# Patient Record
Sex: Male | Born: 1940 | Race: Black or African American | Hispanic: No | Marital: Married | Smoking: Never smoker
Health system: Southern US, Community
[De-identification: ages and names within clinical notes are randomized; demographics above are authoritative.]

## PROBLEM LIST (undated history)

## (undated) DIAGNOSIS — N289 Disorder of kidney and ureter, unspecified: Secondary | ICD-10-CM

## (undated) DIAGNOSIS — E119 Type 2 diabetes mellitus without complications: Secondary | ICD-10-CM

## (undated) DIAGNOSIS — I2510 Atherosclerotic heart disease of native coronary artery without angina pectoris: Secondary | ICD-10-CM

## (undated) DIAGNOSIS — I739 Peripheral vascular disease, unspecified: Secondary | ICD-10-CM

## (undated) DIAGNOSIS — I219 Acute myocardial infarction, unspecified: Secondary | ICD-10-CM

## (undated) HISTORY — PX: CHOLECYSTECTOMY: SHX55

## (undated) HISTORY — PX: BELOW KNEE LEG AMPUTATION: SUR23

---

## 2010-10-11 ENCOUNTER — Emergency Department (HOSPITAL_BASED_OUTPATIENT_CLINIC_OR_DEPARTMENT_OTHER)
Admission: EM | Admit: 2010-10-11 | Discharge: 2010-10-11 | Disposition: A | Discharge status: Home / Self Care | Payer: Medicare Other | Attending: Emergency Medicine | Admitting: Emergency Medicine

## 2010-10-11 ENCOUNTER — Encounter: Payer: Self-pay | Admitting: *Deleted

## 2010-10-11 DIAGNOSIS — Z79899 Other long term (current) drug therapy: Secondary | ICD-10-CM | POA: Insufficient documentation

## 2010-10-11 DIAGNOSIS — C9000 Multiple myeloma not having achieved remission: Secondary | ICD-10-CM | POA: Insufficient documentation

## 2010-10-11 DIAGNOSIS — M79643 Pain in unspecified hand: Secondary | ICD-10-CM

## 2010-10-11 DIAGNOSIS — M79609 Pain in unspecified limb: Secondary | ICD-10-CM | POA: Insufficient documentation

## 2010-10-11 HISTORY — DX: Peripheral vascular disease, unspecified: I73.9

## 2010-10-11 MED ORDER — OXYCODONE-ACETAMINOPHEN 5-325 MG PO TABS
ORAL_TABLET | ORAL | Status: AC
  Filled 2010-10-11: qty 2

## 2010-10-11 MED ORDER — OXYCODONE-ACETAMINOPHEN 5-325 MG PO TABS
2.0000 | ORAL_TABLET | Freq: Once | ORAL | Status: AC
  Administered 2010-10-11: 2 via ORAL

## 2010-10-11 MED ORDER — OXYCODONE-ACETAMINOPHEN 5-325 MG PO TABS: 1.0000 | ORAL_TABLET | Freq: Four times a day (QID) | ORAL | Status: AC | PRN

## 2010-10-11 NOTE — ED Provider Notes (Signed)
History     CSN: [id number] Arrival date & time: 10/11/2010 12:24 AM  Chief Complaint  Patient presents with  . Hand Pain    (Consider location/radiation/quality/duration/timing/severity/associated sxs/prior treatment) HPI This is a 70 year old black male with a history of multiple myeloma who is status post bilateral below-knee amputation in May of this year. He complains of a three-week history of pain in his hands. The pain is worse in the right hand and in the left hand. It is a dull deep pain that is moderate to severe. It is principally present in the first, second and third fingers of the right hand and is accompanied by paresthesias. The pain in the left hand is primarily in the joints and is less severe. He recently ran out of his gabapentin and is also been learning to use an electric wheelchair; he suspects that these may have contributed to the pain. Last week he had pain in his knees but this is resolved. He is undergoing physical therapy to regain ambulation using his prostheses. He has also run out of his oxycodone.  Past Medical History  Diagnosis Date  . Peripheral vascular disease   . Multiple myeloma     Past Surgical History  Procedure Date  . Cholecystectomy   . Below knee leg amputation     No family history on file.  History  Substance Use Topics  . Smoking status: Never Smoker   . Smokeless tobacco: Not on file  . Alcohol Use: No      Review of Systems  All other systems reviewed and are negative.    Allergies  Review of patient's allergies indicates no known allergies.  Home Medications   Current Outpatient Rx  Name Route Sig Dispense Refill  . ASPIRIN 325 MG PO TABS Oral Take 325 mg by mouth daily.      Marland Kitchen CILOSTAZOL 100 MG PO TABS Oral Take 100 mg by mouth 2 (two) times daily.      Marland Kitchen GABAPENTIN 300 MG PO CAPS Oral Take 300 mg by mouth 3 (three) times daily.      Marland Kitchen LENALIDOMIDE 15 MG PO CAPS Oral Take 10 mg by mouth daily.      .  OXYCODONE-ACETAMINOPHEN 5-325 MG PO TABS Oral Take 1 tablet by mouth every 4 (four) hours as needed.      Marland Kitchen PROMETHAZINE HCL 25 MG PO TABS Oral Take 25 mg by mouth every 6 (six) hours as needed.      Marland Kitchen ROSUVASTATIN CALCIUM 20 MG PO TABS Oral Take 20 mg by mouth daily.        BP 126/61  Pulse 68  Temp(Src) 97.7 F (36.5 C) (Oral)  Resp 18  Wt 157 lb (71.215 kg)  SpO2 98%  Physical Exam General: Well-developed, well-nourished male in no acute distress; appearance consistent with age of record HENT: normocephalic, atraumatic Eyes: pupils equal round and reactive to light; extraocular muscles intact; arcus senilis bilaterally Neck: supple Heart: regular rate and rhythm Lungs: clear to auscultation bilaterally Abdomen: soft; nontender; nondistended Extremities: Bilateral below knee amputations; arthritic changes of hands; no tenderness of hands. Negative tine else and Phalen's test; pain present on movement of hands; capillary refill in fingertips brisk Neurologic: Awake, alert and oriented;motor function intact in all extremities and symmetric; no facial droop Skin: Warm and dry; violaceous lesions of proximal interphalangeal joints of the fifth fourth and third fingers of the left hand (patient has had these for an extended period of time and are known  to his oncologist)   ED Course  Procedures (including critical care time)     MDM  The exact etiology of this pain is not clear at this time. The right hand pain is in a pattern suggestive of carpal tunnel syndrome Tinel's and Phalen's tests were negative. The lesions of the left hand could represent a metamyelocyte this but as noted he has been followed for this already. Having run out of his gabapentin and oxycodone I also have contributed to his pain especially in the context of new use of the hands with electrical wheelchair. We will treat his symptoms and refer him back to his physicians.        Hanley Seamen, MD 10/11/10  [phone]

## 2010-10-11 NOTE — ED Notes (Signed)
C/o hand pain and tingling in both hands since earlier today

## 2014-01-14 ENCOUNTER — Other Ambulatory Visit: Payer: Self-pay

## 2014-01-14 ENCOUNTER — Emergency Department (HOSPITAL_BASED_OUTPATIENT_CLINIC_OR_DEPARTMENT_OTHER): Payer: Medicare HMO

## 2014-01-14 ENCOUNTER — Emergency Department (HOSPITAL_BASED_OUTPATIENT_CLINIC_OR_DEPARTMENT_OTHER)
Admission: EM | Admit: 2014-01-14 | Discharge: 2014-01-15 | Disposition: A | Discharge status: Other Acute Inpatient Hospital | Payer: Medicare HMO | Attending: Emergency Medicine | Admitting: Emergency Medicine

## 2014-01-14 ENCOUNTER — Encounter (HOSPITAL_BASED_OUTPATIENT_CLINIC_OR_DEPARTMENT_OTHER): Payer: Self-pay | Admitting: Emergency Medicine

## 2014-01-14 DIAGNOSIS — Z79899 Other long term (current) drug therapy: Secondary | ICD-10-CM | POA: Insufficient documentation

## 2014-01-14 DIAGNOSIS — Z89612 Acquired absence of left leg above knee: Secondary | ICD-10-CM | POA: Diagnosis not present

## 2014-01-14 DIAGNOSIS — Z89511 Acquired absence of right leg below knee: Secondary | ICD-10-CM | POA: Insufficient documentation

## 2014-01-14 DIAGNOSIS — H9312 Tinnitus, left ear: Secondary | ICD-10-CM | POA: Diagnosis present

## 2014-01-14 DIAGNOSIS — Z8579 Personal history of other malignant neoplasms of lymphoid, hematopoietic and related tissues: Secondary | ICD-10-CM | POA: Diagnosis not present

## 2014-01-14 DIAGNOSIS — Z8679 Personal history of other diseases of the circulatory system: Secondary | ICD-10-CM | POA: Diagnosis not present

## 2014-01-14 DIAGNOSIS — Z7982 Long term (current) use of aspirin: Secondary | ICD-10-CM | POA: Diagnosis not present

## 2014-01-14 LAB — CBC WITH DIFFERENTIAL/PLATELET
BASOS PCT: 0 % (ref 0–1)
Basophils Absolute: 0 10*3/uL (ref 0.0–0.1)
EOS ABS: 0.3 10*3/uL (ref 0.0–0.7)
Eosinophils Relative: 4 % (ref 0–5)
HCT: 34 % — ABNORMAL LOW (ref 39.0–52.0)
Hemoglobin: 11.1 g/dL — ABNORMAL LOW (ref 13.0–17.0)
LYMPHS ABS: 1.4 10*3/uL (ref 0.7–4.0)
Lymphocytes Relative: 17 % (ref 12–46)
MCH: 31.9 pg (ref 26.0–34.0)
MCHC: 32.6 g/dL (ref 30.0–36.0)
MCV: 97.7 fL (ref 78.0–100.0)
Monocytes Absolute: 1.2 10*3/uL — ABNORMAL HIGH (ref 0.1–1.0)
Monocytes Relative: 14 % — ABNORMAL HIGH (ref 3–12)
NEUTROS ABS: 5.4 10*3/uL (ref 1.7–7.7)
NEUTROS PCT: 65 % (ref 43–77)
PLATELETS: 247 10*3/uL (ref 150–400)
RBC: 3.48 MIL/uL — AB (ref 4.22–5.81)
RDW: 15.8 % — ABNORMAL HIGH (ref 11.5–15.5)
WBC: 8.3 10*3/uL (ref 4.0–10.5)

## 2014-01-14 LAB — BASIC METABOLIC PANEL
ANION GAP: 7 (ref 5–15)
BUN: 17 mg/dL (ref 6–23)
CO2: 21 mmol/L (ref 19–32)
Calcium: 8.1 mg/dL — ABNORMAL LOW (ref 8.4–10.5)
Chloride: 110 mEq/L (ref 96–112)
Creatinine, Ser: 2.06 mg/dL — ABNORMAL HIGH (ref 0.50–1.35)
GFR, EST AFRICAN AMERICAN: 35 mL/min — AB (ref >90)
GFR, EST NON AFRICAN AMERICAN: 31 mL/min — AB (ref >90)
Glucose, Bld: 108 mg/dL — ABNORMAL HIGH (ref 70–99)
POTASSIUM: 4.3 mmol/L (ref 3.5–5.1)
SODIUM: 138 mmol/L (ref 135–145)

## 2014-01-14 LAB — PROTIME-INR
INR: 0.98 (ref 0.00–1.49)
PROTHROMBIN TIME: 13 s (ref 11.6–15.2)

## 2014-01-14 MED ORDER — SODIUM CHLORIDE 0.9 % IV SOLN
Freq: Once | INTRAVENOUS | Status: AC
  Administered 2014-01-14: 50 mL/h via INTRAVENOUS

## 2014-01-14 NOTE — ED Notes (Signed)
Pt alert, NAD, calm, interactive, resps e/u, speaking in clear complete sentences. C/o ringing and hearing change in L ear. Noticed upon waking at 1800.  Describes ringing as new & constant. (denies: fever, nvd, cough, congestion, cold sx, dizziness, drainage or pain), mentions recent chemo. Denies using drops or Q tips. Wax present in ear noted. Wife present at [facility].

## 2014-01-14 NOTE — ED Notes (Signed)
Pt reports he awoke from sleep with ringing sound in his ears denies any event or injury denies tylenol ingestion

## 2014-01-14 NOTE — ED Provider Notes (Signed)
CSN: [id number]     Arrival date & time 01/14/14  1903 History  This chart was scribed for Quintella Reichert, MD by Jeanell Sparrow, ED Scribe. This patient was seen in room MH11/MH11 and the patient's care was started at 9:38 PM.   Chief Complaint  Patient presents with  . Tinnitus   The history is provided by the patient and the spouse. No language interpreter was used.   HPI Comments: Arthur Dixon is a 74 y.o. male who presents to the Emergency Department complaining of tinnitus in his left ear that started about 3.5 hours ago (530pm), when he woke up.  He was last without complaints at 4pm.  He reports that the ringing sensation is without any pain. He states that he has no prior hx of ringing sensation. He reports that he is currently on Plavix, aspirin, and pletal. He states that he has a hx of DM and multiple myeloma and is undergoing chemotherapy. He denies any numbness, fever, vomiting, or dizziness.  Sxs are moderate, constant, and unchanging.  There are no alleviating or worsening factors.    Past Medical History  Diagnosis Date  . Peripheral vascular disease   . Multiple myeloma    Past Surgical History  Procedure Laterality Date  . Cholecystectomy    . Below knee leg amputation     History reviewed. No pertinent family history. History  Substance Use Topics  . Smoking status: Never Smoker   . Smokeless tobacco: Not on file  . Alcohol Use: No    Review of Systems  Constitutional: Negative for fever.  HENT: Positive for tinnitus. Negative for ear pain.   Gastrointestinal: Negative for vomiting.  Neurological: Negative for dizziness and numbness.  All other systems reviewed and are negative.   Allergies  Review of patient's allergies indicates no known allergies.  Home Medications   Prior to Admission medications   Medication Sig Start Date End Date Taking? Authorizing Provider  aspirin 325 MG tablet Take 325 mg by mouth daily.      Historical Provider, MD   cilostazol (PLETAL) 100 MG tablet Take 100 mg by mouth 2 (two) times daily.      Historical Provider, MD  gabapentin (NEURONTIN) 300 MG capsule Take 300 mg by mouth 3 (three) times daily.      Historical Provider, MD  lenalidomide (REVLIMID) 15 MG capsule Take 10 mg by mouth daily.      Historical Provider, MD  oxyCODONE-acetaminophen (PERCOCET) 5-325 MG per tablet Take 1 tablet by mouth every 4 (four) hours as needed.      Historical Provider, MD  promethazine (PHENERGAN) 25 MG tablet Take 25 mg by mouth every 6 (six) hours as needed.      Historical Provider, MD  rosuvastatin (CRESTOR) 20 MG tablet Take 20 mg by mouth daily.      Historical Provider, MD   BP 158/77 mmHg  Pulse 64  Temp(Src) 98.8 F (37.1 C) (Oral)  Resp 18  Wt 144 lb (65.318 kg)  SpO2 100% Physical Exam  Constitutional: He is oriented to person, place, and time. He appears well-developed and well-nourished.  HENT:  Head: Normocephalic and atraumatic.  Eyes: EOM are normal. Pupils are equal, round, and reactive to light.  Neck:  No carotid bruit  Cardiovascular: Normal rate and regular rhythm.   No murmur heard. Pulmonary/Chest: Effort normal and breath sounds normal. No respiratory distress.  Abdominal: Soft. There is no tenderness. There is no rebound.  Musculoskeletal:  Left aka, right  bka  Neurological: He is alert and oriented to person, place, and time. No cranial nerve deficit. Coordination normal.  5/5 strength in all four extremities, sensation to light touch intact in all four extremities.   Skin: Skin is warm and dry.  Psychiatric: He has a normal mood and affect. His behavior is normal.  Nursing note and vitals reviewed.   ED Course  Procedures (including critical care time) DIAGNOSTIC STUDIES: Oxygen Saturation is 100% on RA, normal by my interpretation.    COORDINATION OF CARE: 9:42 PM- Pt advised of plan for treatment which includes radiology and pt agrees.  Labs Review Labs Reviewed  CBC  WITH DIFFERENTIAL - Abnormal; Notable for the following:    RBC 3.48 (*)    Hemoglobin 11.1 (*)    HCT 34.0 (*)    RDW 15.8 (*)    Monocytes Relative 14 (*)    Monocytes Absolute 1.2 (*)    All other components within normal limits  BASIC METABOLIC PANEL  PROTIME-INR    Imaging Review Ct Head Wo Contrast  01/14/2014   CLINICAL DATA:  Left-sided tinnitus and change in hearing, acute onset. Initial encounter.  EXAM: CT HEAD WITHOUT CONTRAST  TECHNIQUE: Contiguous axial images were obtained from the base of the skull through the vertex without intravenous contrast.  COMPARISON:  None.  FINDINGS: There appears to be an evolving acute infarct at the left occipital lobe, with vaguely decreased attenuation at the left parietal region, raising question for underlying left MCA territory infarct. The latter could be artifactual in nature. There is no evidence of hemorrhagic transformation at this time. No significant vasogenic edema is seen.  Mild periventricular and subcortical white matter change likely reflects small vessel ischemic microangiopathy. Mild cerebellar atrophy is noted.  The brainstem and fourth ventricle are within normal limits. The basal ganglia are unremarkable in appearance. No midline shift is seen.  There is no evidence of fracture; visualized osseous structures are unremarkable in appearance. The orbits are within normal limits. The paranasal sinuses and mastoid air cells are well-aerated. Sclerotic change is noted at the left mastoid process. No significant soft tissue abnormalities are seen.  IMPRESSION: 1. 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 Possible vaguely decreased attenuation at the left parietal region. This raises question for underlying left MCA territory infarct, though the latter might be artifactual in nature. No evidence for hemorrhagic transformation at this time. No evidence of midline shift. 2. Mild small vessel ischemic microangiopathy; mild  cerebellar atrophy seen.  These results were called by telephone at the time of interpretation on 01/14/2014 at 10:31 pm to Dr. Quintella Reichert, who verbally acknowledged these results.   Electronically Signed   By: Garald Balding M.D.   On: 01/14/2014 22:31     EKG Interpretation None     Unable to upload EKG in MUSE.  EKG with sinus rhythm with 1st degree AV block, LAD, no acute ST changes.   MDM   Final diagnoses:  Tinnitus, left   Pt here for evaluation of ringing in the left ear. Patient is neurologically intact on examination. CT scan with evidence of acute evolving occipital infarct. On repeat neurologic exam patient continues to be neurologically intact with a NIH stroke scale of 0. Discussed with patient findings of CT scan and recommendation for discussion with neurology, patient prefers [facility] as that is where he gets most of his care. Discussed with Dr. Sherry Ruffing with neurology at [facility] who agrees to see the patient  in transfer. Patient will be transferred to the emergency department for further evaluation.  I personally performed the services described in this documentation, which was scribed in my presence. The recorded information has been reviewed and is accurate.     Quintella Reichert, MD 01/14/14 2322

## 2014-01-14 NOTE — ED Notes (Signed)
I took ECG, took new set of vitals, placed patient on monitor.

## 2014-01-14 NOTE — ED Notes (Signed)
Dr. Ralene Bathe in to speak with pt & family

## 2014-05-12 ENCOUNTER — Emergency Department (HOSPITAL_BASED_OUTPATIENT_CLINIC_OR_DEPARTMENT_OTHER)
Admission: EM | Admit: 2014-05-12 | Discharge: 2014-05-13 | Disposition: A | Discharge status: Home / Self Care | Payer: Medicare HMO | Attending: Emergency Medicine | Admitting: Emergency Medicine

## 2014-05-12 ENCOUNTER — Encounter (HOSPITAL_BASED_OUTPATIENT_CLINIC_OR_DEPARTMENT_OTHER): Payer: Self-pay

## 2014-05-12 ENCOUNTER — Emergency Department (HOSPITAL_BASED_OUTPATIENT_CLINIC_OR_DEPARTMENT_OTHER): Payer: Medicare HMO

## 2014-05-12 DIAGNOSIS — I739 Peripheral vascular disease, unspecified: Secondary | ICD-10-CM | POA: Insufficient documentation

## 2014-05-12 DIAGNOSIS — I2510 Atherosclerotic heart disease of native coronary artery without angina pectoris: Secondary | ICD-10-CM | POA: Diagnosis not present

## 2014-05-12 DIAGNOSIS — R140 Abdominal distension (gaseous): Secondary | ICD-10-CM | POA: Diagnosis present

## 2014-05-12 DIAGNOSIS — Z9049 Acquired absence of other specified parts of digestive tract: Secondary | ICD-10-CM | POA: Diagnosis not present

## 2014-05-12 DIAGNOSIS — N289 Disorder of kidney and ureter, unspecified: Secondary | ICD-10-CM

## 2014-05-12 DIAGNOSIS — Z7982 Long term (current) use of aspirin: Secondary | ICD-10-CM | POA: Diagnosis not present

## 2014-05-12 DIAGNOSIS — Z89612 Acquired absence of left leg above knee: Secondary | ICD-10-CM | POA: Insufficient documentation

## 2014-05-12 DIAGNOSIS — C9000 Multiple myeloma not having achieved remission: Secondary | ICD-10-CM | POA: Insufficient documentation

## 2014-05-12 DIAGNOSIS — Z89511 Acquired absence of right leg below knee: Secondary | ICD-10-CM | POA: Insufficient documentation

## 2014-05-12 DIAGNOSIS — Z79899 Other long term (current) drug therapy: Secondary | ICD-10-CM | POA: Diagnosis not present

## 2014-05-12 DIAGNOSIS — D649 Anemia, unspecified: Secondary | ICD-10-CM

## 2014-05-12 DIAGNOSIS — R109 Unspecified abdominal pain: Secondary | ICD-10-CM

## 2014-05-12 HISTORY — DX: Atherosclerotic heart disease of native coronary artery without angina pectoris: I25.10

## 2014-05-12 HISTORY — DX: Disorder of kidney and ureter, unspecified: N28.9

## 2014-05-12 LAB — URINALYSIS, ROUTINE W REFLEX MICROSCOPIC
Bilirubin Urine: NEGATIVE
Glucose, UA: 100 mg/dL — AB
Ketones, ur: NEGATIVE mg/dL
Leukocytes, UA: NEGATIVE
NITRITE: NEGATIVE
PH: 7.5 (ref 5.0–8.0)
Specific Gravity, Urine: 1.013 (ref 1.005–1.030)
Urobilinogen, UA: 0.2 mg/dL (ref 0.0–1.0)

## 2014-05-12 LAB — COMPREHENSIVE METABOLIC PANEL
ALBUMIN: 3.2 g/dL — AB (ref 3.5–5.2)
ALK PHOS: 115 U/L (ref 39–117)
ALT: 8 U/L (ref 0–53)
ANION GAP: 9 (ref 5–15)
AST: 16 U/L (ref 0–37)
BILIRUBIN TOTAL: 0.4 mg/dL (ref 0.3–1.2)
BUN: 19 mg/dL (ref 6–23)
CALCIUM: 8.8 mg/dL (ref 8.4–10.5)
CHLORIDE: 102 mmol/L (ref 96–112)
CO2: 25 mmol/L (ref 19–32)
CREATININE: 2.46 mg/dL — AB (ref 0.50–1.35)
GFR calc Af Amer: 29 mL/min — ABNORMAL LOW (ref >90)
GFR, EST NON AFRICAN AMERICAN: 25 mL/min — AB (ref >90)
Glucose, Bld: 146 mg/dL — ABNORMAL HIGH (ref 70–99)
Potassium: 3.9 mmol/L (ref 3.5–5.1)
SODIUM: 136 mmol/L (ref 135–145)
Total Protein: 7.3 g/dL (ref 6.0–8.3)

## 2014-05-12 LAB — URINE MICROSCOPIC-ADD ON

## 2014-05-12 LAB — LIPASE, BLOOD: LIPASE: 18 U/L (ref 11–59)

## 2014-05-12 NOTE — ED Provider Notes (Signed)
CSN: [id number]     Arrival date & time 05/12/14  2147 History  This chart was scribed for Delora Fuel, MD by Chester Holstein, ED Scribe. This patient was seen in room MH03/MH03 and the patient's care was started at 11:27 PM.    Chief Complaint  Patient presents with  . Bloated     The history is provided by the patient. No language interpreter was used.   HPI Comments: Arthur Dixon is a 74 y.o. male with PMHx of PVD, CAD, and renal disorder who presents to the Emergency Department complaining of abdominal bloating with onset 3 months ago worsening 1 week ago. Pt denies any aggravating or alleviating factors. Pt notes associated nausea. Family member states pt had an episode similar to an anxiety attack and was rocking back and forth. She states pt had an US abdomen done on 05/04/14 but states they have not been called with results. Pt with h/o multiple myeloma. Pt has been getting chemotherapy, which family member reports has been halted due to pt's complaint of bloating, and was taken off his Paxil. Pt denies fever, chills, diaphoresis, abdominal pain. vomiting, constipation, and diarrhea. Pt's PCP is Dr. Lajuan Lines.   Past Medical History  Diagnosis Date  . Peripheral vascular disease   . Multiple myeloma   . Coronary artery disease   . Renal disorder    Past Surgical History  Procedure Laterality Date  . Cholecystectomy    . Below knee leg amputation     No family history on file. History  Substance Use Topics  . Smoking status: Never Smoker   . Smokeless tobacco: Not on file  . Alcohol Use: No    Review of Systems  Constitutional: Negative for fever, chills and diaphoresis.  Gastrointestinal: Positive for nausea and abdominal distention. Negative for vomiting, abdominal pain, diarrhea and constipation.  All other systems reviewed and are negative.     Allergies  Review of patient's allergies indicates no known allergies.  Home Medications   Prior to Admission medications    Medication Sig Start Date End Date Taking? Authorizing Provider  acyclovir (ZOVIRAX) 400 MG tablet Take 400 mg by mouth 5 (five) times daily.   Yes Historical Provider, MD  amLODipine (NORVASC) 10 MG tablet Take 10 mg by mouth daily.   Yes Historical Provider, MD  atorvastatin (LIPITOR) 40 MG tablet Take 40 mg by mouth daily at 6 PM.   Yes Historical Provider, MD  calcitRIOL (ROCALTROL) 0.25 MCG capsule Take 0.25 mcg by mouth daily.   Yes Historical Provider, MD  clonazePAM (KLONOPIN) 0.5 MG tablet Take 0.5 mg by mouth 2 (two) times daily as needed for anxiety.   Yes Historical Provider, MD  oxyCODONE (OXY IR/ROXICODONE) 5 MG immediate release tablet Take 5 mg by mouth every 4 (four) hours as needed for severe pain.   Yes Historical Provider, MD  PARoxetine (PAXIL) 10 MG tablet Take 10 mg by mouth daily.   Yes Historical Provider, MD  prochlorperazine (COMPAZINE) 10 MG tablet Take 10 mg by mouth every 6 (six) hours as needed for nausea or vomiting.   Yes Historical Provider, MD  sodium bicarbonate 650 MG tablet Take 650 mg by mouth 4 (four) times daily.   Yes Historical Provider, MD  aspirin 325 MG tablet Take 325 mg by mouth daily.      Historical Provider, MD  cilostazol (PLETAL) 100 MG tablet Take 100 mg by mouth 2 (two) times daily.      Historical Provider, MD  gabapentin (NEURONTIN) 300 MG capsule Take 300 mg by mouth 3 (three) times daily.      Historical Provider, MD  lenalidomide (REVLIMID) 15 MG capsule Take 10 mg by mouth daily.      Historical Provider, MD  oxyCODONE-acetaminophen (PERCOCET) 5-325 MG per tablet Take 1 tablet by mouth every 4 (four) hours as needed.      Historical Provider, MD  promethazine (PHENERGAN) 25 MG tablet Take 25 mg by mouth every 6 (six) hours as needed.      Historical Provider, MD  rosuvastatin (CRESTOR) 20 MG tablet Take 20 mg by mouth daily.      Historical Provider, MD   BP 145/79 mmHg  Pulse 62  Temp(Src) 98.3 F (36.8 C) (Oral)  Resp 20  Wt 144  lb (65.318 kg)  SpO2 98% Physical Exam  Constitutional: He is oriented to person, place, and time. He appears well-developed and well-nourished.  HENT:  Head: Normocephalic.  Eyes: Conjunctivae are normal. Pupils are equal, round, and reactive to light.  Neck: Normal range of motion. Neck supple. No JVD present.  Cardiovascular: Normal rate, regular rhythm and normal heart sounds.   No murmur heard. Pulmonary/Chest: Effort normal. He has no wheezes. He has no rales. He exhibits no tenderness.  Abdominal: Soft. Bowel sounds are normal. He exhibits no distension and no mass. There is no tenderness.  Musculoskeletal: Normal range of motion. He exhibits no edema.  Right BKA and left AKA  Lymphadenopathy:    He has no cervical adenopathy.  Neurological: He is alert and oriented to person, place, and time. No cranial nerve deficit. He exhibits normal muscle tone. Coordination normal.  Skin: Skin is warm and dry. No rash noted.  Psychiatric: His behavior is normal.  flat affect  Nursing note and vitals reviewed.   ED Course  Procedures (including critical care time) DIAGNOSTIC STUDIES: Oxygen Saturation is 98% on room air, normal by my interpretation.    COORDINATION OF CARE: 11:36 PM Discussed treatment plan with patient at beside, the patient agrees with the plan and has no further questions at this time.   Labs Review Results for orders placed or performed during the hospital encounter of 05/12/14  Comprehensive metabolic panel  Result Value Ref Range   Sodium 136 135 - 145 mmol/L   Potassium 3.9 3.5 - 5.1 mmol/L   Chloride 102 96 - 112 mmol/L   CO2 25 19 - 32 mmol/L   Glucose, Bld 146 (H) 70 - 99 mg/dL   BUN 19 6 - 23 mg/dL   Creatinine, Ser 2.46 (H) 0.50 - 1.35 mg/dL   Calcium 8.8 8.4 - 10.5 mg/dL   Total Protein 7.3 6.0 - 8.3 g/dL   Albumin 3.2 (L) 3.5 - 5.2 g/dL   AST 16 0 - 37 U/L   ALT 8 0 - 53 U/L   Alkaline Phosphatase 115 39 - 117 U/L   Total Bilirubin 0.4 0.3 -  1.2 mg/dL   GFR calc non Af Amer 25 (L) >90 mL/min   GFR calc Af Amer 29 (L) >90 mL/min   Anion gap 9 5 - 15  CBC with Differential  Result Value Ref Range   WBC 7.0 4.0 - 10.5 K/uL   RBC 3.79 (L) 4.22 - 5.81 MIL/uL   Hemoglobin 11.8 (L) 13.0 - 17.0 g/dL   HCT 35.9 (L) 39.0 - 52.0 %   MCV 94.7 78.0 - 100.0 fL   MCH 31.1 26.0 - 34.0 pg   MCHC 32.9  30.0 - 36.0 g/dL   RDW 13.8 11.5 - 15.5 %   Platelets 320 150 - 400 K/uL   Neutrophils Relative % 63 43 - 77 %   Lymphocytes Relative 19 12 - 46 %   Monocytes Relative 14 (H) 3 - 12 %   Eosinophils Relative 4 0 - 5 %   Basophils Relative 0 0 - 1 %   Neutro Abs 4.4 1.7 - 7.7 K/uL   Lymphs Abs 1.3 0.7 - 4.0 K/uL   Monocytes Absolute 1.0 0.1 - 1.0 K/uL   Eosinophils Absolute 0.3 0.0 - 0.7 K/uL   Basophils Absolute 0.0 0.0 - 0.1 K/uL   Smear Review LARGE PLATELETS PRESENT   Lipase, blood  Result Value Ref Range   Lipase 18 11 - 59 U/L  Urinalysis, Routine w reflex microscopic  Result Value Ref Range   Color, Urine YELLOW YELLOW   APPearance CLEAR CLEAR   Specific Gravity, Urine 1.013 1.005 - 1.030   pH 7.5 5.0 - 8.0   Glucose, UA 100 (A) NEGATIVE mg/dL   Hgb urine dipstick MODERATE (A) NEGATIVE   Bilirubin Urine NEGATIVE NEGATIVE   Ketones, ur NEGATIVE NEGATIVE mg/dL   Protein, ur >300 (A) NEGATIVE mg/dL   Urobilinogen, UA 0.2 0.0 - 1.0 mg/dL   Nitrite NEGATIVE NEGATIVE   Leukocytes, UA NEGATIVE NEGATIVE  Urine microscopic-add on  Result Value Ref Range   Squamous Epithelial / LPF RARE RARE   WBC, UA 0-2 <3 WBC/hpf   RBC / HPF 7-10 <3 RBC/hpf   Casts GRANULAR CAST (A) NEGATIVE   Imaging Review Ct Abdomen Pelvis Wo Contrast  05/13/2014   CLINICAL DATA:  Chronic abdominal bloating.  Initial encounter.  EXAM: CT ABDOMEN AND PELVIS WITHOUT CONTRAST  TECHNIQUE: Multidetector CT imaging of the abdomen and pelvis was performed following the standard protocol without IV contrast.  COMPARISON:  Abdominal ultrasound performed 05/03/2014   FINDINGS: The visualized lung bases are clear. Minimal calcification is noted at the aortic valve. A coronary artery stent is seen. Trace pericardial fluid remains within normal limits.  The liver and spleen are unremarkable in appearance. The patient is status post cholecystectomy, with clips noted along the gallbladder fossa. Diffuse apparent calcification is seen along the atrophic body and tail of the pancreas, with minimal calcification at the pancreatic head, likely reflecting sequelae of chronic pancreatitis. The adrenal glands are unremarkable.  Nonspecific perinephric stranding and fluid is noted bilaterally. A 6 mm nonobstructing stone is noted at the lower pole of the right kidney. There is no evidence of hydronephrosis. No renal or ureteral stones are seen. Given mild stranding extending along the ureters, mild pyelonephritis cannot be entirely excluded.  No free fluid is identified. The small bowel is unremarkable in appearance. The stomach is within normal limits.  Mild soft tissue inflammation is noted along the distal abdominal aorta and common iliac arteries. This may reflect a variety of etiologies. No aneurysmal dilatation is seen. Minimal calcification is seen along the abdominal aorta.  The appendix is normal in caliber, without evidence of appendicitis. Contrast progresses to the proximal transverse colon. The colon is unremarkable in appearance.  The bladder is moderately distended and grossly unremarkable. The prostate is enlarged, measuring 5.6 cm in transverse dimension. No inguinal lymphadenopathy is seen.  No acute osseous abnormalities are identified. A few high-density foci within the visualized osseous structures likely reflect bone islands.  IMPRESSION: 1. Mild soft tissue inflammation about the distal abdominal aorta and common iliac arteries. This  could reflect isolated chronic periaortitis or retroperitoneal fibrosis; no evidence of aneurysmal dilatation. Evaluation is somewhat  suboptimal without contrast. 2. Mild stranding and an fluid noted about both kidneys, with mild stranding along the proximally ureters. Mild pyelonephritis cannot be entirely excluded. Would correlate with urinalysis results. 3. Diffuse calcification along the atrophic body and tail of the pancreas, with minimal calcification at the pancreatic head, likely reflecting sequelae of chronic pancreatitis. 4. Nonobstructing 6 mm stone at the lower pole of the right kidney. 5. Enlarged prostate noted.   Electronically Signed   By: Garald Balding M.D.   On: 05/13/2014 02:17     MDM   Final diagnoses:  Abdominal pain, unspecified abdominal location    Abdominal discomfort of uncertain cause. Patient with history of multiple myeloma and currently not getting chemotherapy. Abdominal exam is benign. He has also has known renal insufficiency. Will get CT scan of abdomen and pelvis to look for evidence of pathology.  Laboratory workup shows renal insufficiency is slightly worse than baseline, and normochromic normocytic anemia which is asked she has somewhat improved from baseline and presumably is secondary to underlying myeloma. CT scan shows areas of inflammation around kidneys and aorta and I wonder if he may have some degree of retroperitoneal fibrosis from either his myeloma or the chemotherapy. This will need further outpatient workup by. I have explained the right third nebulizer findings to the patient and family. He has a follow-up appointment with his oncologist in about 2 weeks and is to keep that appointment. Consider gastroenterology evaluation if not improving with holiday from chemotherapy.   I personally performed the services described in this documentation, which was scribed in my presence. The recorded information has been reviewed and is accurate.       Delora Fuel, MD 83/25/49 [id number]

## 2014-05-12 NOTE — ED Notes (Signed)
Pt c/o abdominal bloating x3 months, denies n/v/d; states saw PCP for same with no relief. LNBM yesterday

## 2014-05-13 LAB — CBC WITH DIFFERENTIAL/PLATELET
BASOS ABS: 0 10*3/uL (ref 0.0–0.1)
BASOS PCT: 0 % (ref 0–1)
EOS ABS: 0.3 10*3/uL (ref 0.0–0.7)
Eosinophils Relative: 4 % (ref 0–5)
HEMATOCRIT: 35.9 % — AB (ref 39.0–52.0)
HEMOGLOBIN: 11.8 g/dL — AB (ref 13.0–17.0)
Lymphocytes Relative: 19 % (ref 12–46)
Lymphs Abs: 1.3 10*3/uL (ref 0.7–4.0)
MCH: 31.1 pg (ref 26.0–34.0)
MCHC: 32.9 g/dL (ref 30.0–36.0)
MCV: 94.7 fL (ref 78.0–100.0)
MONO ABS: 1 10*3/uL (ref 0.1–1.0)
Monocytes Relative: 14 % — ABNORMAL HIGH (ref 3–12)
NEUTROS ABS: 4.4 10*3/uL (ref 1.7–7.7)
Neutrophils Relative %: 63 % (ref 43–77)
Platelets: 320 10*3/uL (ref 150–400)
RBC: 3.79 MIL/uL — ABNORMAL LOW (ref 4.22–5.81)
RDW: 13.8 % (ref 11.5–15.5)
WBC: 7 10*3/uL (ref 4.0–10.5)

## 2014-05-13 MED ORDER — IOHEXOL 300 MG/ML  SOLN
50.0000 mL | Freq: Once | INTRAMUSCULAR | Status: AC | PRN
  Administered 2014-05-13: 50 mL via ORAL

## 2014-05-13 NOTE — Discharge Instructions (Signed)
Return if there are any problems. °

## 2015-10-09 ENCOUNTER — Emergency Department (HOSPITAL_BASED_OUTPATIENT_CLINIC_OR_DEPARTMENT_OTHER)
Admission: EM | Admit: 2015-10-09 | Discharge: 2015-10-09 | Disposition: A | Discharge status: Other Acute Inpatient Hospital | Payer: Medicare HMO | Attending: Emergency Medicine | Admitting: Emergency Medicine

## 2015-10-09 ENCOUNTER — Emergency Department (HOSPITAL_BASED_OUTPATIENT_CLINIC_OR_DEPARTMENT_OTHER): Payer: Medicare HMO

## 2015-10-09 ENCOUNTER — Encounter (HOSPITAL_BASED_OUTPATIENT_CLINIC_OR_DEPARTMENT_OTHER): Payer: Self-pay | Admitting: Emergency Medicine

## 2015-10-09 DIAGNOSIS — R109 Unspecified abdominal pain: Secondary | ICD-10-CM | POA: Insufficient documentation

## 2015-10-09 DIAGNOSIS — R1110 Vomiting, unspecified: Secondary | ICD-10-CM | POA: Diagnosis present

## 2015-10-09 DIAGNOSIS — E1122 Type 2 diabetes mellitus with diabetic chronic kidney disease: Secondary | ICD-10-CM | POA: Insufficient documentation

## 2015-10-09 DIAGNOSIS — Z992 Dependence on renal dialysis: Secondary | ICD-10-CM | POA: Insufficient documentation

## 2015-10-09 DIAGNOSIS — R7989 Other specified abnormal findings of blood chemistry: Secondary | ICD-10-CM

## 2015-10-09 DIAGNOSIS — I739 Peripheral vascular disease, unspecified: Secondary | ICD-10-CM | POA: Diagnosis not present

## 2015-10-09 DIAGNOSIS — C9000 Multiple myeloma not having achieved remission: Secondary | ICD-10-CM | POA: Diagnosis not present

## 2015-10-09 DIAGNOSIS — N186 End stage renal disease: Secondary | ICD-10-CM | POA: Insufficient documentation

## 2015-10-09 DIAGNOSIS — Z79899 Other long term (current) drug therapy: Secondary | ICD-10-CM | POA: Diagnosis not present

## 2015-10-09 DIAGNOSIS — R778 Other specified abnormalities of plasma proteins: Secondary | ICD-10-CM

## 2015-10-09 DIAGNOSIS — I2510 Atherosclerotic heart disease of native coronary artery without angina pectoris: Secondary | ICD-10-CM | POA: Insufficient documentation

## 2015-10-09 DIAGNOSIS — Z9049 Acquired absence of other specified parts of digestive tract: Secondary | ICD-10-CM | POA: Insufficient documentation

## 2015-10-09 DIAGNOSIS — I214 Non-ST elevation (NSTEMI) myocardial infarction: Secondary | ICD-10-CM

## 2015-10-09 DIAGNOSIS — Z7982 Long term (current) use of aspirin: Secondary | ICD-10-CM | POA: Diagnosis not present

## 2015-10-09 DIAGNOSIS — I252 Old myocardial infarction: Secondary | ICD-10-CM | POA: Insufficient documentation

## 2015-10-09 DIAGNOSIS — Z794 Long term (current) use of insulin: Secondary | ICD-10-CM | POA: Diagnosis not present

## 2015-10-09 HISTORY — DX: Acute myocardial infarction, unspecified: I21.9

## 2015-10-09 HISTORY — DX: Type 2 diabetes mellitus without complications: E11.9

## 2015-10-09 LAB — CBC WITH DIFFERENTIAL/PLATELET
BASOS ABS: 0 10*3/uL (ref 0.0–0.1)
BASOS PCT: 1 %
EOS ABS: 0.1 10*3/uL (ref 0.0–0.7)
Eosinophils Relative: 2 %
HEMATOCRIT: 33.7 % — AB (ref 39.0–52.0)
HEMOGLOBIN: 10 g/dL — AB (ref 13.0–17.0)
Lymphocytes Relative: 21 %
Lymphs Abs: 1.1 10*3/uL (ref 0.7–4.0)
MCH: 29.8 pg (ref 26.0–34.0)
MCHC: 29.7 g/dL — AB (ref 30.0–36.0)
MCV: 100.3 fL — ABNORMAL HIGH (ref 78.0–100.0)
Monocytes Absolute: 0.8 10*3/uL (ref 0.1–1.0)
Monocytes Relative: 16 %
NEUTROS ABS: 3.1 10*3/uL (ref 1.7–7.7)
NEUTROS PCT: 60 %
Platelets: 193 10*3/uL (ref 150–400)
RBC: 3.36 MIL/uL — ABNORMAL LOW (ref 4.22–5.81)
RDW: 17.8 % — ABNORMAL HIGH (ref 11.5–15.5)
WBC: 5.1 10*3/uL (ref 4.0–10.5)

## 2015-10-09 LAB — COMPREHENSIVE METABOLIC PANEL
ALK PHOS: 111 U/L (ref 38–126)
ALT: 15 U/L — ABNORMAL LOW (ref 17–63)
ANION GAP: 11 (ref 5–15)
AST: 20 U/L (ref 15–41)
Albumin: 3.2 g/dL — ABNORMAL LOW (ref 3.5–5.0)
BILIRUBIN TOTAL: 0.7 mg/dL (ref 0.3–1.2)
BUN: 23 mg/dL — ABNORMAL HIGH (ref 6–20)
CALCIUM: 8.3 mg/dL — AB (ref 8.9–10.3)
CO2: 27 mmol/L (ref 22–32)
Chloride: 101 mmol/L (ref 101–111)
Creatinine, Ser: 4.68 mg/dL — ABNORMAL HIGH (ref 0.61–1.24)
GFR calc Af Amer: 13 mL/min — ABNORMAL LOW (ref >60)
GFR calc non Af Amer: 11 mL/min — ABNORMAL LOW (ref >60)
GLUCOSE: 102 mg/dL — AB (ref 65–99)
POTASSIUM: 3.6 mmol/L (ref 3.5–5.1)
SODIUM: 139 mmol/L (ref 135–145)
TOTAL PROTEIN: 7.2 g/dL (ref 6.5–8.1)

## 2015-10-09 LAB — TROPONIN I
TROPONIN I: 0.09 ng/mL — AB (ref <0.03)
Troponin I: 0.08 ng/mL (ref <0.03)

## 2015-10-09 LAB — LIPASE, BLOOD: Lipase: 19 U/L (ref 11–51)

## 2015-10-09 MED ORDER — SODIUM CHLORIDE 0.9 % IV BOLUS (SEPSIS)
500.0000 mL | Freq: Once | INTRAVENOUS | Status: AC
  Administered 2015-10-09: 500 mL via INTRAVENOUS

## 2015-10-09 MED ORDER — ASPIRIN 325 MG PO TABS
325.0000 mg | ORAL_TABLET | Freq: Once | ORAL | Status: AC
  Administered 2015-10-09: 325 mg via ORAL
  Filled 2015-10-09: qty 1

## 2015-10-09 MED ORDER — OXYCODONE-ACETAMINOPHEN 5-325 MG PO TABS
1.0000 | ORAL_TABLET | ORAL | Status: DC | PRN
  Administered 2015-10-09: 1 via ORAL
  Filled 2015-10-09: qty 1

## 2015-10-09 MED ORDER — OXYCODONE-ACETAMINOPHEN 5-325 MG PO TABS: 1.0000 | ORAL_TABLET | Freq: Once | ORAL | Status: DC

## 2015-10-09 MED ORDER — SODIUM CHLORIDE 0.9 % IV BOLUS (SEPSIS)
1000.0000 mL | Freq: Once | INTRAVENOUS | Status: DC
  Administered 2015-10-09: 1000 mL via INTRAVENOUS

## 2015-10-09 NOTE — ED Notes (Signed)
Paged hospitalist at [facility] for doctor on patient, will call back on [phone]

## 2015-10-09 NOTE — ED Notes (Signed)
[place]-- referring to [employer] for admittings

## 2015-10-09 NOTE — ED Provider Notes (Addendum)
[place] DEPT [facility] Provider Note   CSN: [id number] Arrival date & time: 10/09/15  6151     History   Chief Complaint Chief Complaint  Patient presents with  . Emesis    HPI Jonmichael Beadnell is a 75 y.o. male hx of CAD with MI (last stent was 2011), multiple myeloma with weekly chemo injections, here with chest pain, diaphoresis. Patient states that he woke up around 2 AM this morning and broke out in a sweat and had some chest pain and diaphoresis. Denies any radiation to the pain. He had an episode of vomiting during that time. He states that he is pain-free currently. He states that he had 2 heart attacks before and Usually with atypical symptoms. He is also a dialysis patient and finish his dialysis yesterday. He feels lightheaded and dizzy today and wife took his pulse and was noted to be tachycardic around 120. No hx of PE. Has bilateral BKA.    The history is provided by the patient.    Past Medical History:  Diagnosis Date  . Coronary artery disease   . Diabetes mellitus without complication ([place])   . MI (myocardial infarction) ([place])   . Multiple myeloma   . Peripheral vascular disease ([place])   . Renal disorder     There are no active problems to display for this patient.   Past Surgical History:  Procedure Laterality Date  . BELOW KNEE LEG AMPUTATION    . CHOLECYSTECTOMY         Home Medications    Prior to Admission medications   Medication Sig Start Date End Date Taking? Authorizing Provider  acyclovir (ZOVIRAX) 400 MG tablet Take 400 mg by mouth 5 (five) times daily.   Yes Historical Provider, MD  amLODipine (NORVASC) 10 MG tablet Take 10 mg by mouth daily.   Yes Historical Provider, MD  aspirin 325 MG tablet Take 325 mg by mouth daily.     Yes Historical Provider, MD  gabapentin (NEURONTIN) 300 MG capsule Take 300 mg by mouth 3 (three) times daily.     Yes Historical Provider, MD  insulin detemir (LEVEMIR) 100 UNIT/ML injection Inject into the skin at  bedtime.   Yes Historical Provider, MD  ketorolac (ACULAR) 0.5 % ophthalmic solution 1 drop 4 (four) times daily.   Yes Historical Provider, MD  oxyCODONE-acetaminophen (PERCOCET) 5-325 MG per tablet Take 1 tablet by mouth every 4 (four) hours as needed.     Yes Historical Provider, MD  prednisoLONE acetate (PRED FORTE) 1 % ophthalmic suspension 1 drop 4 (four) times daily.   Yes Historical Provider, MD  prochlorperazine (COMPAZINE) 10 MG tablet Take 10 mg by mouth every 6 (six) hours as needed for nausea or vomiting.   Yes Historical Provider, MD  promethazine (PHENERGAN) 25 MG tablet Take 25 mg by mouth every 6 (six) hours as needed.     Yes Historical Provider, MD  sertraline (ZOLOFT) 50 MG tablet Take 50 mg by mouth daily.   Yes Historical Provider, MD  sevelamer carbonate (RENVELA) 800 MG tablet Take 800 mg by mouth 3 (three) times daily with meals.   Yes Historical Provider, MD  atorvastatin (LIPITOR) 40 MG tablet Take 40 mg by mouth daily at 6 PM.    Historical Provider, MD  calcitRIOL (ROCALTROL) 0.25 MCG capsule Take 0.25 mcg by mouth daily.    Historical Provider, MD  cilostazol (PLETAL) 100 MG tablet Take 100 mg by mouth 2 (two) times daily.  Historical Provider, MD  clonazePAM (KLONOPIN) 0.5 MG tablet Take 0.5 mg by mouth 2 (two) times daily as needed for anxiety.    Historical Provider, MD  lenalidomide (REVLIMID) 15 MG capsule Take 10 mg by mouth daily.      Historical Provider, MD  oxyCODONE (OXY IR/ROXICODONE) 5 MG immediate release tablet Take 5 mg by mouth every 4 (four) hours as needed for severe pain.    Historical Provider, MD  PARoxetine (PAXIL) 10 MG tablet Take 10 mg by mouth daily.    Historical Provider, MD  rosuvastatin (CRESTOR) 20 MG tablet Take 20 mg by mouth daily.      Historical Provider, MD  sodium bicarbonate 650 MG tablet Take 650 mg by mouth 4 (four) times daily.    Historical Provider, MD    Family History No family history on file.  Social  History Social History  Substance Use Topics  . Smoking status: Never Smoker  . Smokeless tobacco: Never Used  . Alcohol use No     Allergies   Review of patient's allergies indicates no known allergies.   Review of Systems Review of Systems  Cardiovascular: Positive for chest pain.  Gastrointestinal: Positive for vomiting.  All other systems reviewed and are negative.    Physical Exam Updated Vital Signs BP 125/82   Pulse (!) 45   Temp 97.7 F (36.5 C) (Oral)   Resp 15   Wt 143 lb (64.9 kg)   SpO2 97%   Physical Exam  Constitutional: He is oriented to person, place, and time.  Chronically ill appearing   HENT:  Head: Normocephalic.  Eyes: EOM are normal. Pupils are equal, round, and reactive to light.  Neck: Normal range of motion. Neck supple.  Cardiovascular: Normal rate, regular rhythm and normal heart sounds.   Pulmonary/Chest:  Crackles bilateral bases, no wheezing   Abdominal: Soft.  Mild epigastric tenderness, no rebound   Musculoskeletal:  Bilateral BKA, no obvious edema or tenderness or swelling   Neurological: He is alert and oriented to person, place, and time.  Skin: Skin is warm.  Psychiatric: He has a normal mood and affect.  Nursing note and vitals reviewed.    ED Treatments / Results  Labs (all labs ordered are listed, but only abnormal results are displayed) Labs Reviewed  CBC WITH DIFFERENTIAL/PLATELET - Abnormal; Notable for the following:       Result Value   RBC 3.36 (*)    Hemoglobin 10.0 (*)    HCT 33.7 (*)    MCV 100.3 (*)    MCHC 29.7 (*)    RDW 17.8 (*)    All other components within normal limits  COMPREHENSIVE METABOLIC PANEL - Abnormal; Notable for the following:    Glucose, Bld 102 (*)    BUN 23 (*)    Creatinine, Ser 4.68 (*)    Calcium 8.3 (*)    Albumin 3.2 (*)    ALT 15 (*)    GFR calc non Af Amer 11 (*)    GFR calc Af Amer 13 (*)    All other components within normal limits  TROPONIN I - Abnormal; Notable  for the following:    Troponin I 0.08 (*)    All other components within normal limits  LIPASE, BLOOD  URINALYSIS, ROUTINE W REFLEX MICROSCOPIC (NOT AT [facility])    EKG  EKG Interpretation  Date/Time:  Tuesday October 09 2015 10:11:35 EDT Ventricular Rate:  91 PR Interval:    QRS Duration: 97 QT  Interval:  398 QTC Calculation: 490 R Axis:   -65 Text Interpretation:  Sinus arrhythmia Multiple premature complexes, vent & supraven Left anterior fascicular block Anterior infarct, old Nonspecific T abnormalities, lateral leads TWI new since previous  Confirmed by Seham Gardenhire  MD, [place] ([id number]) on 10/09/2015 10:24:10 AM       Radiology Dg Chest 2 View  Result Date: 10/09/2015 CLINICAL DATA:  Chest pain EXAM: CHEST  2 VIEW COMPARISON:  06/08/2014 chest radiograph. FINDINGS: Stable cardiomediastinal silhouette with mild cardiomegaly. No pneumothorax. Small left pleural effusion. No right pleural effusion. Borderline mild pulmonary edema. Mild left basilar atelectasis. Cholecystectomy clips are seen in the upper abdomen. IMPRESSION: 1. Mild cardiomegaly with borderline mild pulmonary edema, suggesting mild congestive heart failure. 2. Small left pleural effusion and mild left basilar atelectasis. Electronically Signed   By: Ilona Sorrel M.D.   On: 10/09/2015 11:22   Dg Abd 2 Views  Result Date: 10/09/2015 CLINICAL DATA:  Abdominal pain. EXAM: ABDOMEN - 2 VIEW COMPARISON:  05/13/2014 CT abdomen/pelvis. FINDINGS: No disproportionately dilated small bowel loops. No significant air-fluid levels. No significant colonic stool. No evidence of pneumatosis or pneumoperitoneum. Coarse calcifications in the left upper quadrant in a linear configuration, consistent with chronic pancreatitis. Lower right renal 7 mm stone. Cholecystectomy clips are seen in the right upper quadrant of the abdomen. Stable bone island in the right upper pubic symphysis. 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 Facet arthropathy in the lower lumbar spine. Small left pleural effusion. IMPRESSION: 1. Nonobstructive bowel gas pattern. 2. Chronic pancreatitis. 3. Right renal stone. 4. Small left pleural effusion. Electronically Signed   By: Ilona Sorrel M.D.   On: 10/09/2015 11:24    Procedures Procedures (including critical care time)  CRITICAL CARE Performed by: Wandra Arthurs   Total critical care time: 30 minutes  Critical care time was exclusive of separately billable procedures and treating other patients.  Critical care was necessary to treat or prevent imminent or life-threatening deterioration.  Critical care was time spent personally by me on the following activities: development of treatment plan with patient and/or surrogate as well as nursing, discussions with consultants, evaluation of patient's response to treatment, examination of patient, obtaining history from patient or surrogate, ordering and performing treatments and interventions, ordering and review of laboratory studies, ordering and review of radiographic studies, pulse oximetry and re-evaluation of patient's condition.   Medications Ordered in ED Medications  sodium chloride 0.9 % bolus 500 mL (500 mLs Intravenous New Bag/Given 10/09/15 1032)  aspirin tablet 325 mg (325 mg Oral Given 10/09/15 1126)     Initial Impression / Assessment and Plan / ED Course  I have reviewed the triage vital signs and the nursing notes.  Pertinent labs & imaging results that were available during my care of the patient were reviewed by me and considered in my medical decision making (see chart for details).  Clinical Course   Devarion Mcclanahan is a 75 y.o. male here with chest pain, vomiting, epigastric pain. Concerned for possible ACS. Has nonspecific TWI on EKG new since previous. He has known myeloma with retroperitoneal fibrosis so consider worsening myeloma as well. Will get labs, trop, CXR. Will likely need to admit for r/o ACS.    11:30 am Trop positive 0.08. Repeat EKG unchanged. Given ASA. Held heparin since he has myeloma and ESRD on HD. Family request [facility]. Discussed with hospitalist there. There are no beds there and won't be accepting transfer  at this time. Family request [facility]. His hematologist and primary care doctors are there.   12 pm Called Dr. Amil Amen, hospitalist at [facility]. There are no beds there but he is accepted there and if there is a bed, he can be transferred. He recommend serial troponins. Next trop around 4pm. Offered to transfer to [facility] but family adamant that they don't want to go to [facility].   2 pm Still no beds at [facility]. Family wants me to check with [facility]. [facility] but the bed control states that there are still no beds so they are no accepting transfer. Family wants to wait for beds at [facility] and doesn't want to go to [facility].   3:12 PM Still no beds at [facility]. Family wants to go to [facility]. Called EDP there, Dr Idolina Primer can't arrange for ED to ED transfer due to lack of beds. Called Dr. Quillian Quince, his cardiologist, who will direct admit for elevated trop, possible NSTEMI   Final Clinical Impressions(s) / ED Diagnoses   Final diagnoses:  Abdominal pain    New Prescriptions New Prescriptions   No medications on file     Drenda Freeze, MD 10/09/15 [place] Maigan Bittinger, MD 10/09/15 [phone]

## 2015-10-09 NOTE — ED Notes (Signed)
Patient transported to X-ray 

## 2015-10-09 NOTE — ED Triage Notes (Addendum)
Pt vomited  x1 and broke out in a sweat around 2 this morning per family. Pt states he feels better now, denies chest pain, SOB. Pt is currently being tx with chemotherapy for myeloma

## 2015-10-09 NOTE — ED Notes (Signed)
MD at bedside. 

## 2015-10-09 NOTE — ED Notes (Signed)
[facility] on patient for doctor---have no clean beds-- over 20 hours of people waiting in the ED for beds

## 2015-10-09 NOTE — ED Notes (Addendum)
[facility] came to transport patient to [facility] and had to wait on paper work, we wasn't told about bed #453 and HP-[facility] was coming.

## 2016-09-13 DEATH — deceased

## 2018-02-07 IMAGING — DX DG CHEST 2V
2 series · 2 of 2 positions shown · non-contrast
Comparison: 06/08/2014 chest radiograph.

CLINICAL DATA: Chest pain

EXAM:
CHEST  2 VIEW

[chest lat]
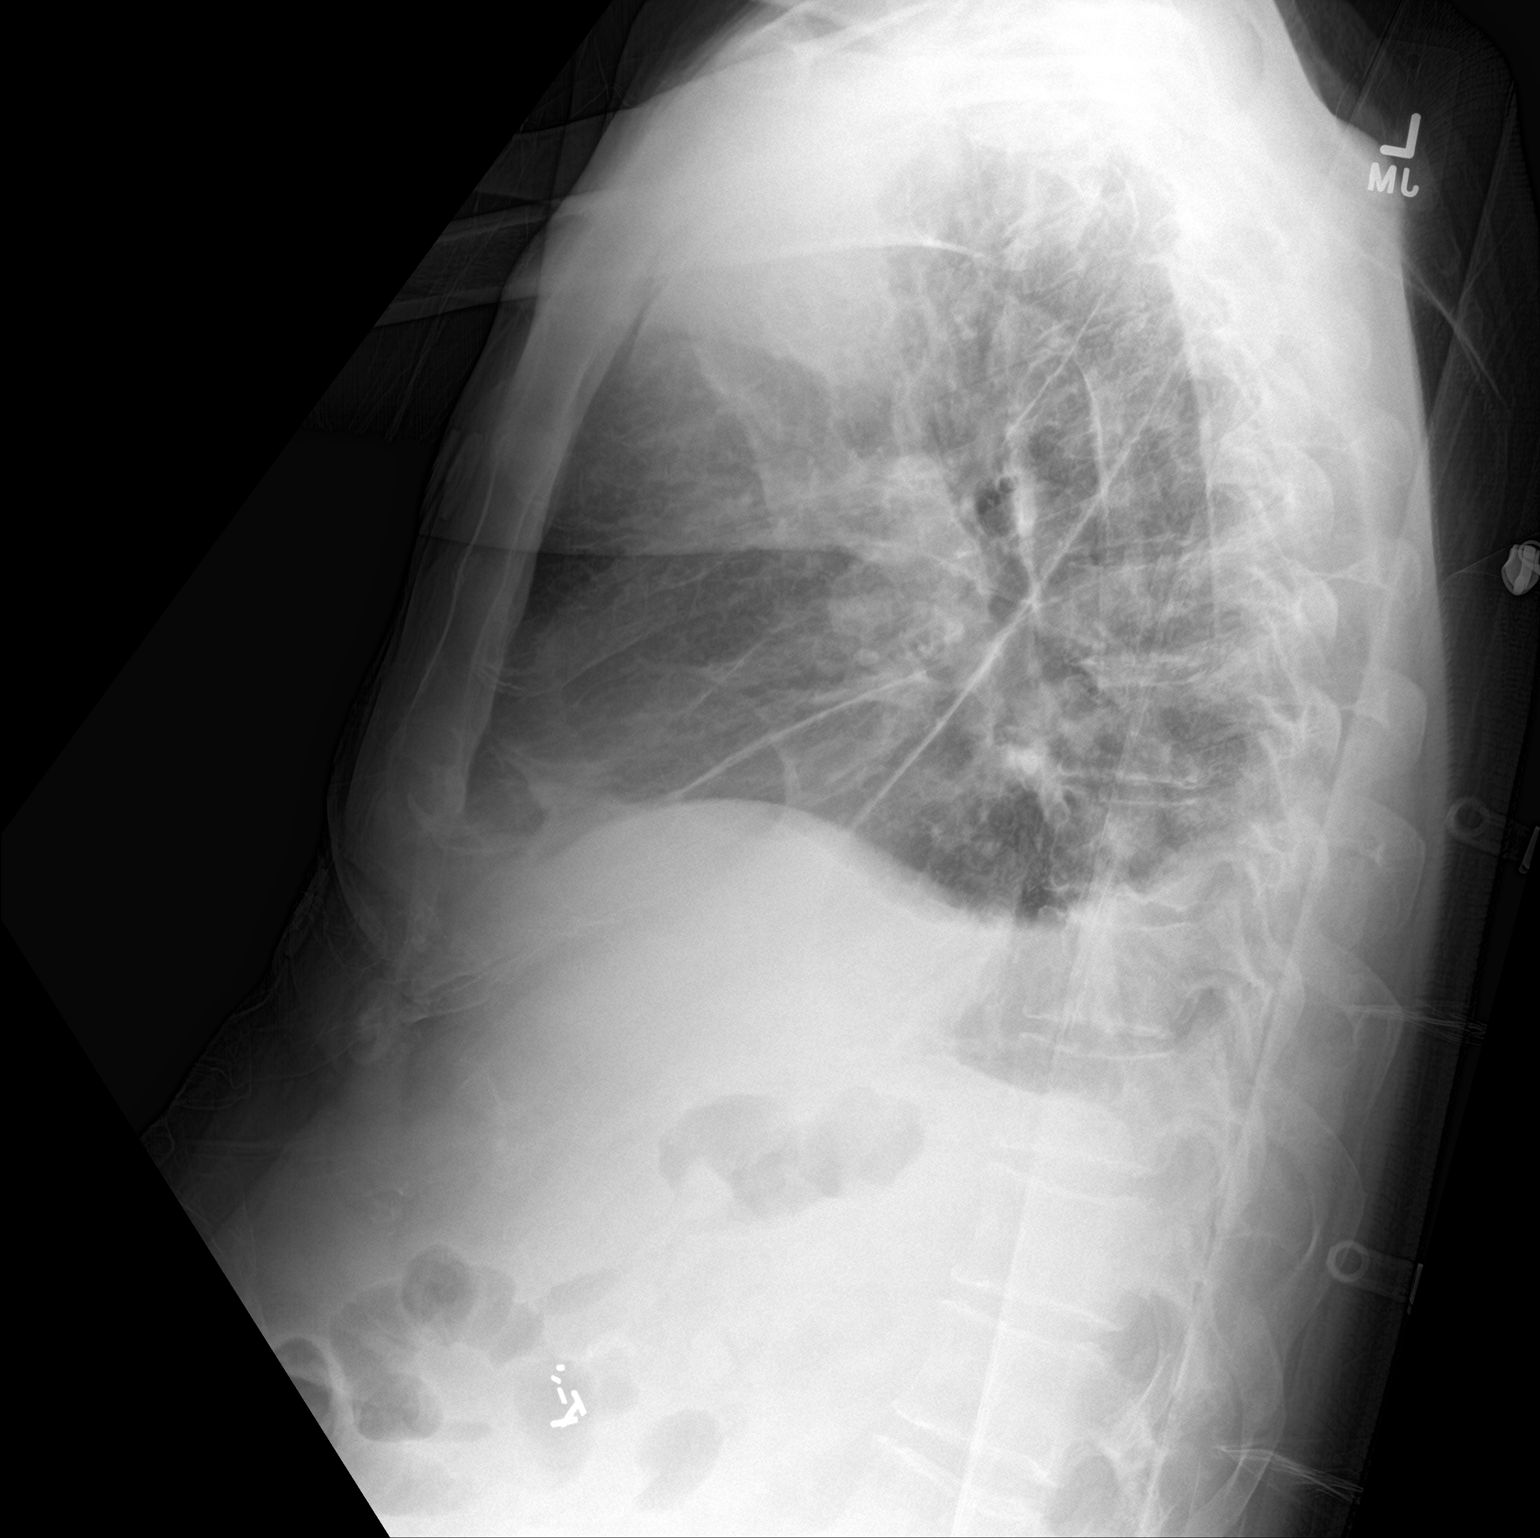

[chest ap]
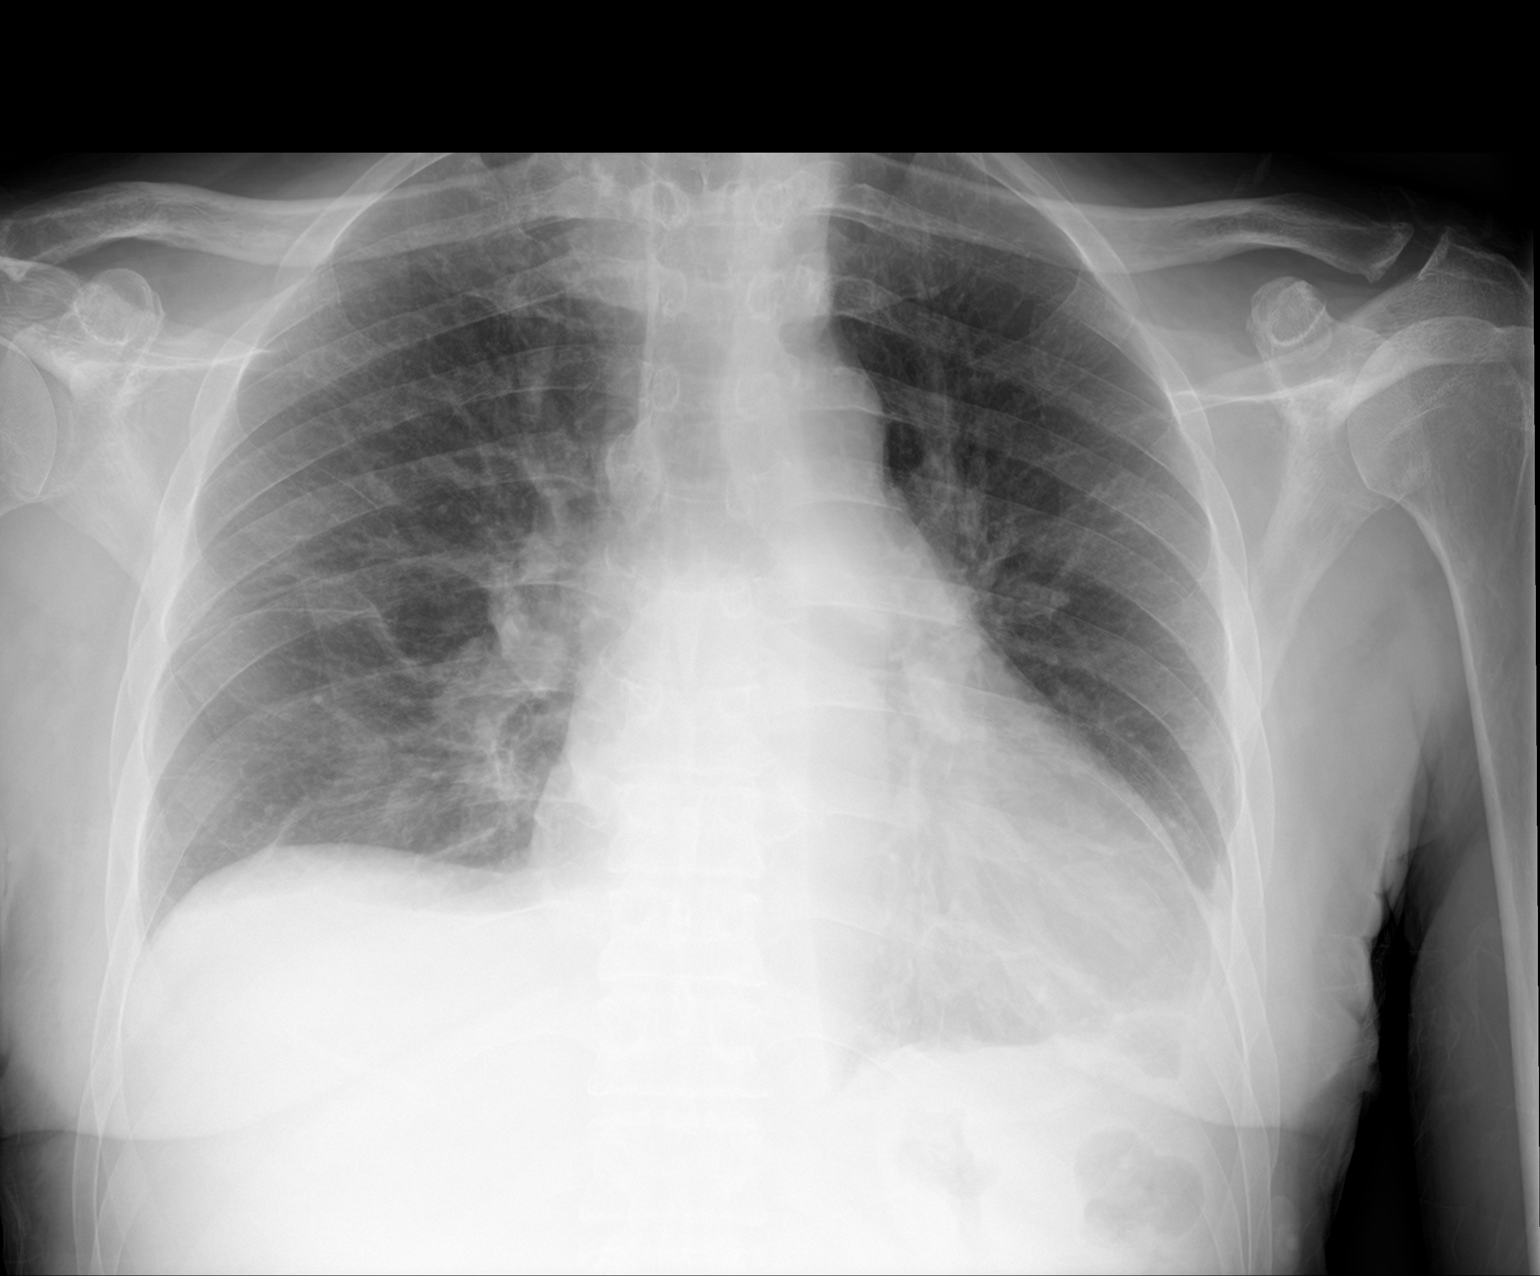

[2 of 2 positions shown; findings below may reference images not displayed]

FINDINGS: Stable cardiomediastinal silhouette with mild cardiomegaly. No
pneumothorax. Small left pleural effusion. No right pleural
effusion. Borderline mild pulmonary edema. Mild left basilar
atelectasis. Cholecystectomy clips are seen in the upper abdomen.
IMPRESSION: 1. Mild cardiomegaly with borderline mild pulmonary edema,
suggesting mild congestive heart failure.
2. Small left pleural effusion and mild left basilar atelectasis.
